# Patient Record
Sex: Female | Born: 1995 | Race: White | Hispanic: No | Marital: Single | Smoking: Never smoker
Health system: Southern US, Community
[De-identification: ages and names within clinical notes are randomized; demographics above are authoritative.]

## PROBLEM LIST (undated history)

## (undated) DIAGNOSIS — R55 Syncope and collapse: Secondary | ICD-10-CM

---

## 2016-05-28 ENCOUNTER — Emergency Department
Admission: EM | Admit: 2016-05-28 | Discharge: 2016-05-28 | Disposition: A | Discharge status: Home / Self Care | Payer: BLUE CROSS/BLUE SHIELD | Attending: Emergency Medicine | Admitting: Emergency Medicine

## 2016-05-28 ENCOUNTER — Emergency Department: Payer: BLUE CROSS/BLUE SHIELD

## 2016-05-28 ENCOUNTER — Encounter: Payer: Self-pay | Admitting: Emergency Medicine

## 2016-05-28 DIAGNOSIS — R1013 Epigastric pain: Secondary | ICD-10-CM | POA: Insufficient documentation

## 2016-05-28 DIAGNOSIS — R1030 Lower abdominal pain, unspecified: Secondary | ICD-10-CM | POA: Insufficient documentation

## 2016-05-28 DIAGNOSIS — R55 Syncope and collapse: Secondary | ICD-10-CM

## 2016-05-28 DIAGNOSIS — R112 Nausea with vomiting, unspecified: Secondary | ICD-10-CM | POA: Diagnosis not present

## 2016-05-28 DIAGNOSIS — R1031 Right lower quadrant pain: Secondary | ICD-10-CM

## 2016-05-28 HISTORY — DX: Syncope and collapse: R55

## 2016-05-28 LAB — CBC
HEMATOCRIT: 35.7 % (ref 35.0–47.0)
Hemoglobin: 11.8 g/dL — ABNORMAL LOW (ref 12.0–16.0)
MCH: 27.9 pg (ref 26.0–34.0)
MCHC: 33 g/dL (ref 32.0–36.0)
MCV: 84.5 fL (ref 80.0–100.0)
PLATELETS: 307 10*3/uL (ref 150–440)
RBC: 4.23 MIL/uL (ref 3.80–5.20)
RDW: 19.6 % — AB (ref 11.5–14.5)
WBC: 7.3 10*3/uL (ref 3.6–11.0)

## 2016-05-28 LAB — LIPASE, BLOOD: Lipase: 22 U/L (ref 11–51)

## 2016-05-28 LAB — URINALYSIS, COMPLETE (UACMP) WITH MICROSCOPIC
BACTERIA UA: NONE SEEN
Bilirubin Urine: NEGATIVE
Glucose, UA: NEGATIVE mg/dL
Ketones, ur: NEGATIVE mg/dL
NITRITE: NEGATIVE
PH: 8 (ref 5.0–8.0)
Protein, ur: 100 mg/dL — AB
SPECIFIC GRAVITY, URINE: 1.029 (ref 1.005–1.030)

## 2016-05-28 LAB — BASIC METABOLIC PANEL
Anion gap: 7 (ref 5–15)
BUN: 15 mg/dL (ref 6–20)
CO2: 26 mmol/L (ref 22–32)
CREATININE: 0.63 mg/dL (ref 0.44–1.00)
Calcium: 9.3 mg/dL (ref 8.9–10.3)
Chloride: 107 mmol/L (ref 101–111)
GFR calc Af Amer: >60 mL/min (ref >60)
GLUCOSE: 115 mg/dL — AB (ref 65–99)
POTASSIUM: 3.5 mmol/L (ref 3.5–5.1)
Sodium: 140 mmol/L (ref 135–145)

## 2016-05-28 LAB — WET PREP, GENITAL
Clue Cells Wet Prep HPF POC: NONE SEEN
Sperm: NONE SEEN
TRICH WET PREP: NONE SEEN
YEAST WET PREP: NONE SEEN

## 2016-05-28 LAB — HEPATIC FUNCTION PANEL
ALK PHOS: 56 U/L (ref 38–126)
ALT: 17 U/L (ref 14–54)
AST: 29 U/L (ref 15–41)
Albumin: 4.2 g/dL (ref 3.5–5.0)
BILIRUBIN TOTAL: 0.5 mg/dL (ref 0.3–1.2)
Total Protein: 7.4 g/dL (ref 6.5–8.1)

## 2016-05-28 LAB — POCT PREGNANCY, URINE: Preg Test, Ur: NEGATIVE

## 2016-05-28 LAB — CHLAMYDIA/NGC RT PCR (ARMC ONLY)
CHLAMYDIA TR: NOT DETECTED
N gonorrhoeae: NOT DETECTED

## 2016-05-28 MED ORDER — IBUPROFEN 800 MG PO TABS: 800.0000 mg | ORAL_TABLET | Freq: Three times a day (TID) | ORAL | 0 refills | Status: AC | PRN

## 2016-05-28 MED ORDER — KETOROLAC TROMETHAMINE 30 MG/ML IJ SOLN
30.0000 mg | Freq: Once | INTRAMUSCULAR | Status: AC
  Administered 2016-05-28: 30 mg via INTRAVENOUS
  Filled 2016-05-28: qty 1

## 2016-05-28 MED ORDER — SODIUM CHLORIDE 0.9 % IV BOLUS (SEPSIS)
1000.0000 mL | Freq: Once | INTRAVENOUS | Status: AC
  Administered 2016-05-28: 1000 mL via INTRAVENOUS

## 2016-05-28 MED ORDER — ONDANSETRON HCL 4 MG/2ML IJ SOLN
4.0000 mg | Freq: Once | INTRAMUSCULAR | Status: AC
  Administered 2016-05-28: 4 mg via INTRAVENOUS
  Filled 2016-05-28: qty 2

## 2016-05-28 NOTE — ED Triage Notes (Signed)
Pt comes into the ED via POV c/o RLQ abdominal pain right over the pubic bone.  Patient states she also had a syncopal episode earlier today as well.  Patient has h/o of sever menstrual cramps during her cycle and h/o syncopal episodes during that time.  Patient also has h/o anemia in the past. Patient c/o dizziness and lightheadedness.  Patient presents pale in color.

## 2016-05-28 NOTE — ED Provider Notes (Signed)
[facility] Emergency Department Provider Note  ____________________________________________  Time seen: Approximately 4:37 PM  I have reviewed the triage vital signs and the nursing notes.   HISTORY  Chief Complaint Abdominal Pain and Loss of Consciousness    HPI Jenna Patel is a 21 y.o. female with a history of recurrent syncope presenting with abdominal pain and syncope. The patient reports that 2 weeks ago she was diagnosed with influenza and started on Tamiflu and Sudafed. Afterwards, she developed significant abdominal bloating and diffuse pain, and was seen at [facility] in the primary care department, as her symptoms were resolving. She was discharged with instructions to follow up with her pain returned. Last night, the patient developed a midline and right lower quadrant abdominal pain "like small sticking knives" that is better if she presses on it or lays in fetal position and worse if she lays on her back or stands up. This morning, she began to menstrual rate. She has had one episode of vomiting due to pain. She also had a brief syncopal episode without any tonic-clonic activity, or injury. No constipation or diarrhea, change in vaginal discharge or dysuria. No fevers or chills. Travel outside the [place]. The patient is sexually active with men, and uses condoms. Patient has been worked up for gynecologic pain without any findings in the past, prior to becoming sexually active.  SH: sexually active, college student FH: no FH of IBD   Past Medical History:  Diagnosis Date  . Syncope     There are no active problems to display for this patient.   History reviewed. No pertinent surgical history.    Allergies Cats claw (uncaria tomentosa) and Dust mite extract  No family history on file.  Social History Social History  Substance Use Topics  . Smoking status: Never Smoker  . Smokeless tobacco: Never Used  . Alcohol use Yes     Review of Systems Constitutional: No fever/chills. Positive syncope. Negative myalgias. Eyes: No visual changes. No eye discharge. ENT: No sore throat. No congestion or rhinorrhea. Cardiovascular: Denies chest pain. Denies palpitations. Respiratory: Denies shortness of breath.  No cough. Gastrointestinal: Positive epigastric, right lower quadrant and suprapubic abdominal pain.  Positive nausea, positive vomiting.  No diarrhea.  No constipation. Genitourinary: Negative for dysuria. No urinary frequency. Positive menstruation at this time. Musculoskeletal: Negative for back pain. Skin: Negative for rash. Neurological: Negative for headaches. No focal numbness, tingling or weakness.   10-point ROS otherwise negative.  ____________________________________________   PHYSICAL EXAM:  VITAL SIGNS: ED Triage Vitals  Enc Vitals Group     BP 05/28/16 1555 113/67     Pulse Rate 05/28/16 1555 79     Resp 05/28/16 1555 18     Temp 05/28/16 1555 97.7 F (36.5 C)     Temp Source 05/28/16 1555 Oral     SpO2 05/28/16 1555 100 %     Weight 05/28/16 1555 127 lb (57.6 kg)     Height 05/28/16 1555 5' 6.5" (1.689 m)     Head Circumference --      Peak Flow --      Pain Score 05/28/16 1556 8     Pain Loc --      Pain Edu? --      Excl. in GC? --     Constitutional: Alert and oriented. Well appearing and in no acute distress. Answers questions appropriately. Eyes: Conjunctivae are normal.  EOMI. No scleral icterus. Head: Atraumatic. Nose: No congestion/rhinnorhea. Mouth/Throat:  Mucous membranes are moist.  Neck: No stridor.  Supple.  No meningismus. Cardiovascular: Normal rate, regular rhythm. No murmurs, rubs or gallops.  Respiratory: Normal respiratory effort.  No accessory muscle use or retractions. Lungs CTAB.  No wheezes, rales or ronchi. Gastrointestinal: Soft, and nondistended.  The patient has an internist to palpation in the epigastrium, suprapubic region, and most notably in the  right lower quadrant. No guarding or rebound.  No peritoneal signs. Genitourinary: Normal-appearing external genitalia without lesions. Vaginal exam with mild menstrual blood and mild mucous discharge, normal-appearing cervix, normal vaginal wall tissue. Bimanual exam is negative for CMT, positive right tenderness to palpation with fullness. Musculoskeletal: No LE edema.  Neurologic:  A&Ox3.  Speech is clear.  Face and smile are symmetric.  EOMI.  Moves all extremities well. Skin:  Skin is warm, dry and intact. No rash noted. Psychiatric: Mood and affect are normal. Speech and behavior are normal.  Normal judgement.  ____________________________________________   LABS (all labs ordered are listed, but only abnormal results are displayed)  Labs Reviewed  WET PREP, GENITAL - Abnormal; Notable for the following:       Result Value   WBC, Wet Prep HPF POC FEW (*)    All other components within normal limits  BASIC METABOLIC PANEL - Abnormal; Notable for the following:    Glucose, Bld 115 (*)    All other components within normal limits  CBC - Abnormal; Notable for the following:    Hemoglobin 11.8 (*)    RDW 19.6 (*)    All other components within normal limits  URINALYSIS, COMPLETE (UACMP) WITH MICROSCOPIC - Abnormal; Notable for the following:    Color, Urine YELLOW (*)    APPearance HAZY (*)    Hgb urine dipstick MODERATE (*)    Protein, ur 100 (*)    Leukocytes, UA TRACE (*)    Squamous Epithelial / LPF 0-5 (*)    All other components within normal limits  HEPATIC FUNCTION PANEL - Abnormal; Notable for the following:    Bilirubin, Direct <0.1 (*)    All other components within normal limits  CHLAMYDIA/NGC RT PCR ([facility] ONLY)  LIPASE, BLOOD  CBG MONITORING, ED  POC URINE PREG, ED  POCT PREGNANCY, URINE   ____________________________________________  EKG  ED ECG REPORT I, Rockne MenghiniNorman, Anne-Caroline, the attending physician, personally viewed and interpreted this  ECG.   Date: 05/28/2016  EKG Time: 1604  Rate: 68  Rhythm: normal sinus rhythm  Axis: normal  Intervals:none  ST&T Change: Nonspecific T-wave inversions in V1 and V2. No ST elevation. No evidence of Brugada syndrome, prolonged QTC, or hypertrophy.  ____________________________________________  RADIOLOGY  [place] Transvaginal Non-ob  Result Date: 05/28/2016 CLINICAL DATA:  Right lower quadrant abdominal pain. EXAM: TRANSABDOMINAL AND TRANSVAGINAL ULTRASOUND OF PELVIS DOPPLER ULTRASOUND OF OVARIES TECHNIQUE: Both transabdominal and transvaginal ultrasound examinations of the pelvis were performed. Transabdominal technique was performed for global imaging of the pelvis including uterus, ovaries, adnexal regions, and pelvic cul-de-sac. It was necessary to proceed with endovaginal exam following the transabdominal exam to visualize the ovaries. Color and duplex Doppler ultrasound was utilized to evaluate blood flow to the ovaries. COMPARISON:  None. FINDINGS: Uterus Measurements: 7.6 x 4.3 x 5.5 cm, within normal limits. No fibroids or other mass visualized. Endometrium Thickness: 7 mm, within normal limits. No focal abnormality visualized. Right ovary Measurements: 2.6 x 1.5 x 2.0 cm, within normal limits. Normal appearance/no adnexal mass. Left ovary Measurements: 2.0 x 2.5 x 2.6 cm, within normal  limits. Normal appearance/no adnexal mass. Pulsed Doppler evaluation of both ovaries demonstrates normal low-resistance arterial and venous waveforms. Other findings No abnormal free fluid. IMPRESSION: Negative pelvic ultrasound. No acute or focal lesion to explain the patient's right lower quadrant abdominal pain. Electronically Signed   By: Marin Roberts M.D.   On: 05/28/2016 19:08   US Pelvis Complete  Result Date: 05/28/2016 CLINICAL DATA:  Right lower quadrant abdominal pain. EXAM: TRANSABDOMINAL AND TRANSVAGINAL ULTRASOUND OF PELVIS DOPPLER ULTRASOUND OF OVARIES TECHNIQUE: Both transabdominal and  transvaginal ultrasound examinations of the pelvis were performed. Transabdominal technique was performed for global imaging of the pelvis including uterus, ovaries, adnexal regions, and pelvic cul-de-sac. It was necessary to proceed with endovaginal exam following the transabdominal exam to visualize the ovaries. Color and duplex Doppler ultrasound was utilized to evaluate blood flow to the ovaries. COMPARISON:  None. FINDINGS: Uterus Measurements: 7.6 x 4.3 x 5.5 cm, within normal limits. No fibroids or other mass visualized. Endometrium Thickness: 7 mm, within normal limits. No focal abnormality visualized. Right ovary Measurements: 2.6 x 1.5 x 2.0 cm, within normal limits. Normal appearance/no adnexal mass. Left ovary Measurements: 2.0 x 2.5 x 2.6 cm, within normal limits. Normal appearance/no adnexal mass. Pulsed Doppler evaluation of both ovaries demonstrates normal low-resistance arterial and venous waveforms. Other findings No abnormal free fluid. IMPRESSION: Negative pelvic ultrasound. No acute or focal lesion to explain the patient's right lower quadrant abdominal pain. Electronically Signed   By: Marin Roberts M.D.   On: 05/28/2016 19:08   [place] Art/ven Flow Abd Pelv Doppler  Result Date: 05/28/2016 CLINICAL DATA:  Right lower quadrant abdominal pain. EXAM: TRANSABDOMINAL AND TRANSVAGINAL ULTRASOUND OF PELVIS DOPPLER ULTRASOUND OF OVARIES TECHNIQUE: Both transabdominal and transvaginal ultrasound examinations of the pelvis were performed. Transabdominal technique was performed for global imaging of the pelvis including uterus, ovaries, adnexal regions, and pelvic cul-de-sac. It was necessary to proceed with endovaginal exam following the transabdominal exam to visualize the ovaries. Color and duplex Doppler ultrasound was utilized to evaluate blood flow to the ovaries. COMPARISON:  None. FINDINGS: Uterus Measurements: 7.6 x 4.3 x 5.5 cm, within normal limits. No fibroids or other mass visualized.  Endometrium Thickness: 7 mm, within normal limits. No focal abnormality visualized. Right ovary Measurements: 2.6 x 1.5 x 2.0 cm, within normal limits. Normal appearance/no adnexal mass. Left ovary Measurements: 2.0 x 2.5 x 2.6 cm, within normal limits. Normal appearance/no adnexal mass. Pulsed Doppler evaluation of both ovaries demonstrates normal low-resistance arterial and venous waveforms. Other findings No abnormal free fluid. IMPRESSION: Negative pelvic ultrasound. No acute or focal lesion to explain the patient's right lower quadrant abdominal pain. Electronically Signed   By: Marin Roberts M.D.   On: 05/28/2016 19:08    ____________________________________________   PROCEDURES  Procedure(s) performed: None  Procedures  Critical Care performed: No ____________________________________________   INITIAL IMPRESSION / ASSESSMENT AND PLAN / ED COURSE  Pertinent labs & imaging results that were available during my care of the patient were reviewed by me and considered in my medical decision making (see chart for details).  21 y.o. female presenting with suprapubic, epigastric and right lower quadrant pain associated with one episode of nausea and vomiting, as well as a two-week history of bloating. Overall, the patient has reassuring vital signs and is afebrile. There are multiple possible causes for her symptoms. I will perform a pelvic examination to determine whether she warrants imaging by ultrasound of her pelvis or CT scan of the abdomen. Ovarian pathology including  cyst or torsion is possible, I would also consider appendicitis. Inflammatory bowel disease is also possible although the patient has not been having any diarrhea and has no family history of this. She may also have gas, IBS, or pain from her menstruation.  ----------------------------------------- 7:54 PM on 05/28/2016 -----------------------------------------  The patient is completely symptomatic at this time.  Her white blood cell count is normal, or left lites are normal, her pelvic ultrasound does not show any acute pathology. Her urinalysis is not sure UTI and she is not pregnant. At this time, I have reevaluated her and she has no tenderness on my examination. Appendicitis with a very unlikely although I am discharging her with appendectomy precautions. Plan discharge. Close PMD follow-up, gynecology follow-up and return directions were discussed. ____________________________________________  FINAL CLINICAL IMPRESSION(S) / ED DIAGNOSES  Final diagnoses:  Lower abdominal pain  Epigastric pain  Non-intractable vomiting with nausea, unspecified vomiting type  Syncope, unspecified syncope type         NEW MEDICATIONS STARTED DURING THIS VISIT:  New Prescriptions   No medications on file      Rockne Menghini, MD 05/28/16 1954

## 2016-05-28 NOTE — ED Notes (Signed)
Pt discharged to home.  Friend driving.  Discharge instructions reviewed.  Verbalized understanding.  No questions or concerns at this time.  Teach back verified.  Pt in NAD.  No items left in ED.   

## 2016-05-28 NOTE — Discharge Instructions (Signed)
Please return to the emergency department for severe pain, fever, inability to keep down fluids, lightheadedness or fainting, or any other symptoms concerning to you.

## 2018-03-20 IMAGING — US US TRANSVAGINAL NON-OB
1 series · 13 of 25 positions shown · non-contrast
Comparison: None.

CLINICAL DATA: Right lower quadrant abdominal pain.

EXAM:
TRANSABDOMINAL AND TRANSVAGINAL ULTRASOUND OF PELVIS
DOPPLER ULTRASOUND OF OVARIES
TECHNIQUE: Both transabdominal and transvaginal ultrasound examinations of the
pelvis were performed. Transabdominal technique was performed for
global imaging of the pelvis including uterus, ovaries, adnexal
regions, and pelvic cul-de-sac.
It was necessary to proceed with endovaginal exam following the
transabdominal exam to visualize the ovaries. Color and duplex
Doppler ultrasound was utilized to evaluate blood flow to the
ovaries.

[Series 1: us transvaginal non-ob · 0.20mm/px · 13 of 101 slices shown]
[im 1/101]
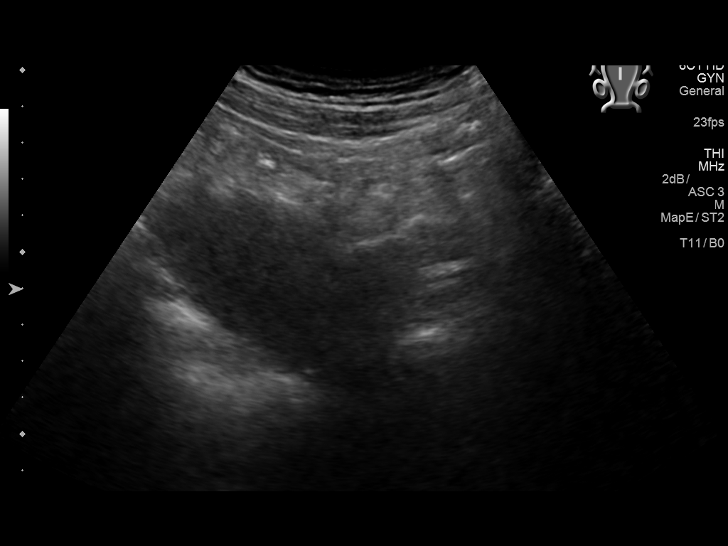
[im 9/101]
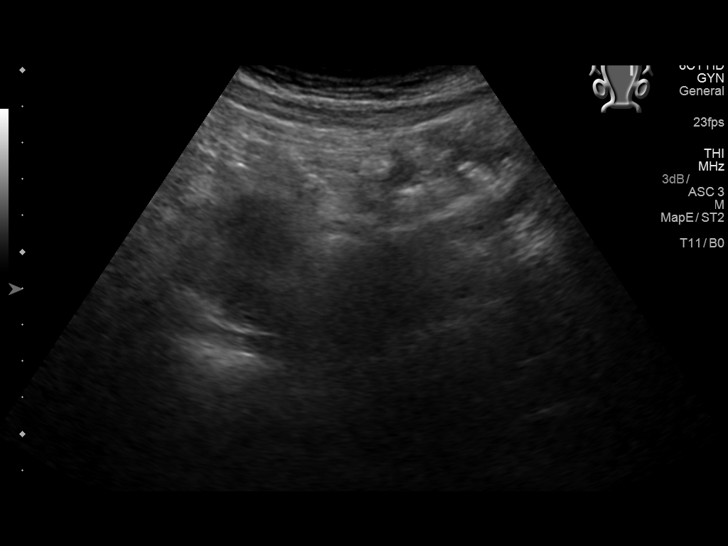
[im 17/101]
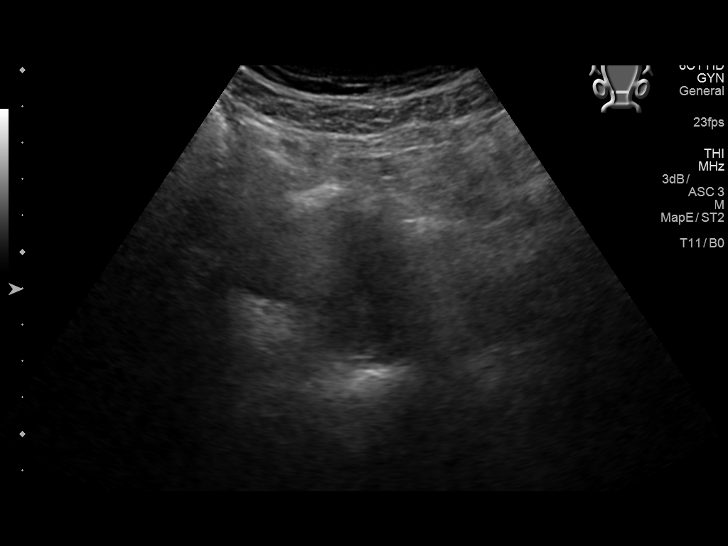
[im 26/101]
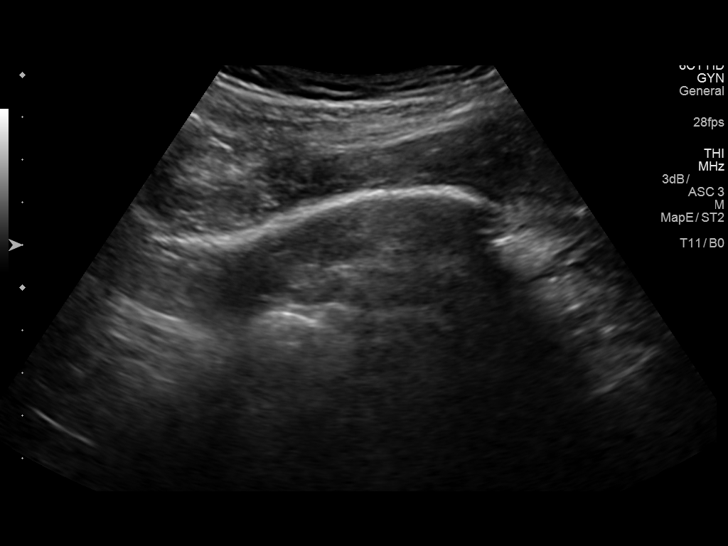
[im 34/101]
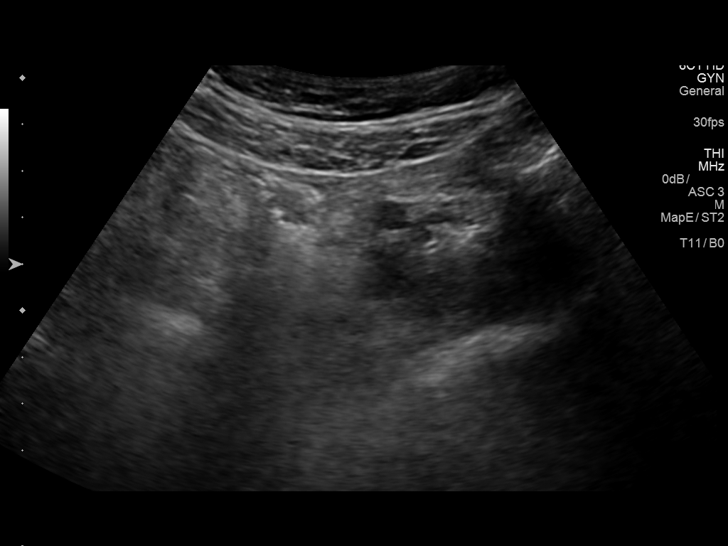
[im 42/101]
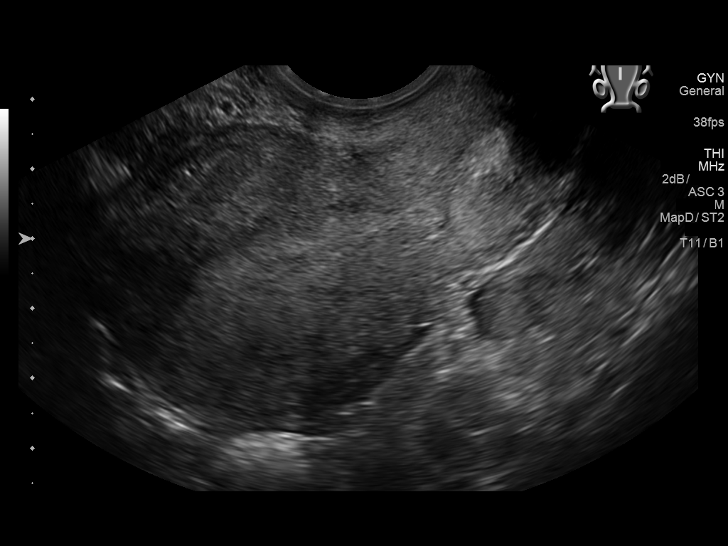
[im 51/101]
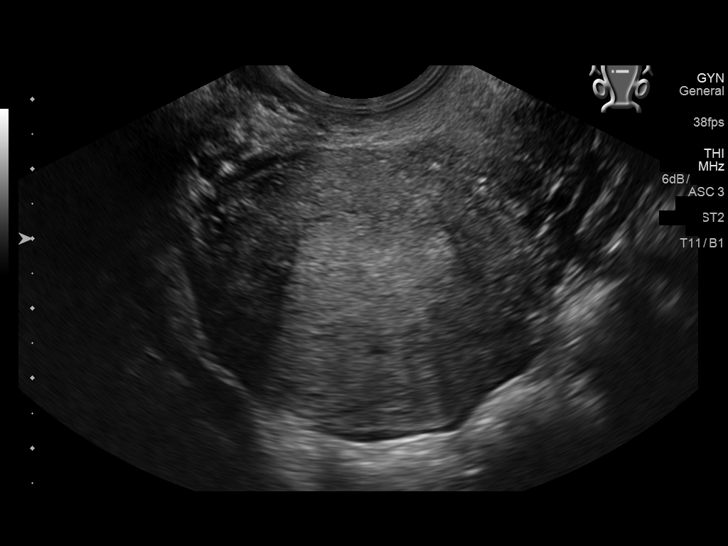
[im 59/101]
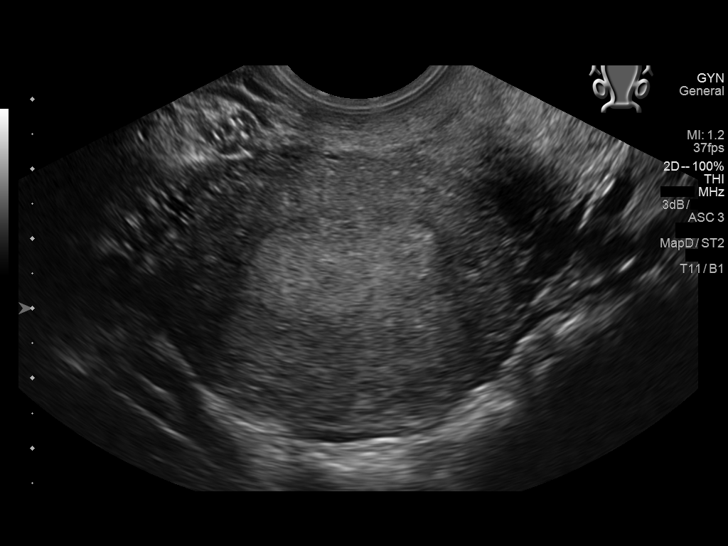
[im 67/101]
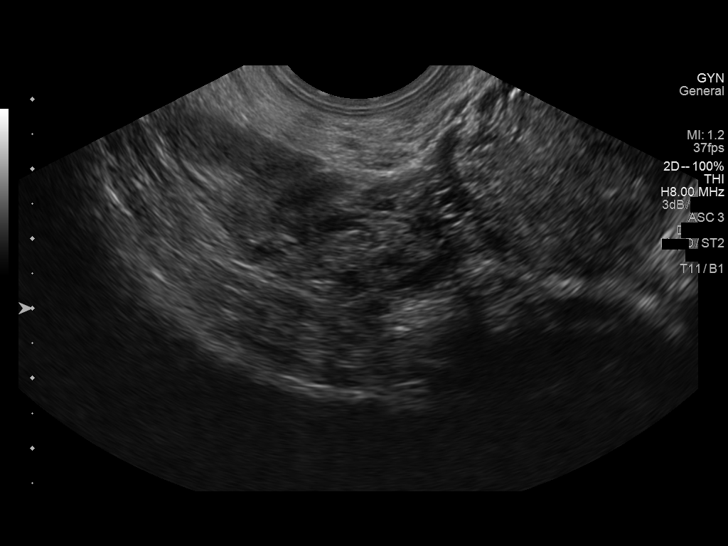
[im 76/101]
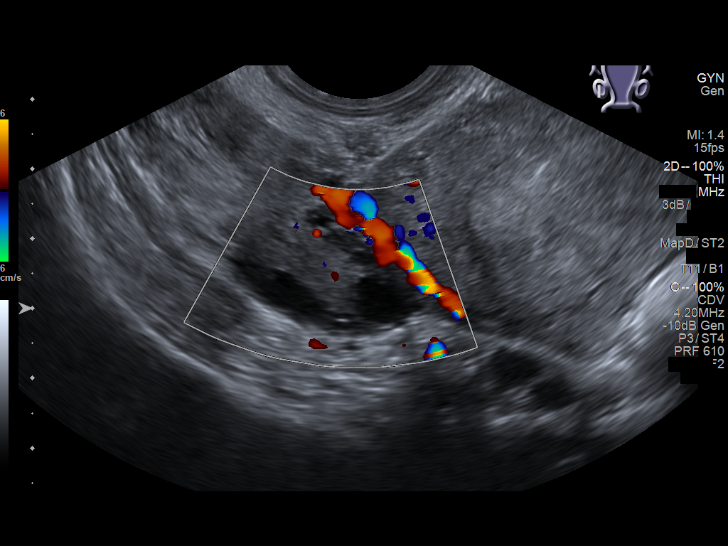
[im 84/101]
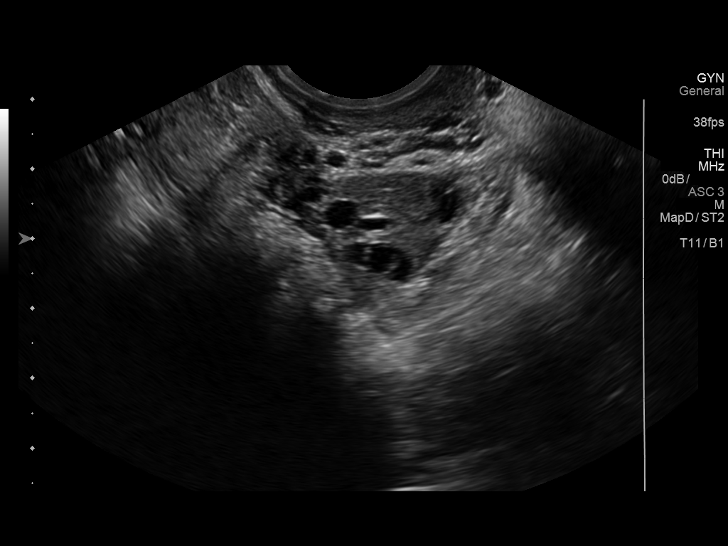
[im 92/101]
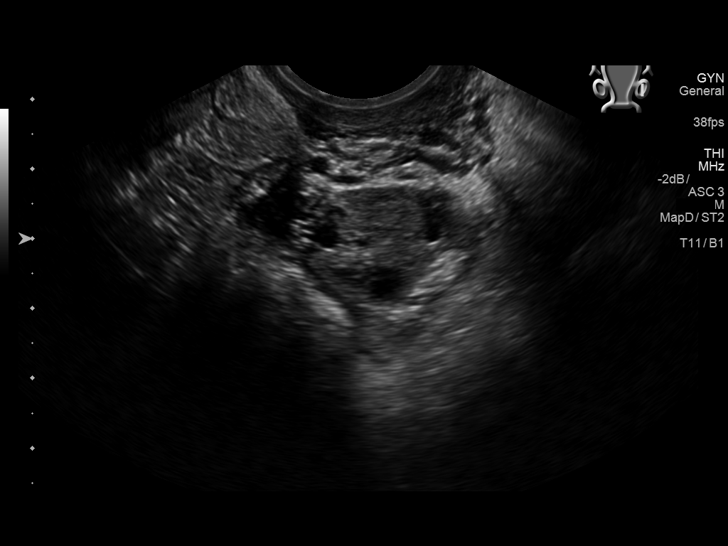
[im 101/101]
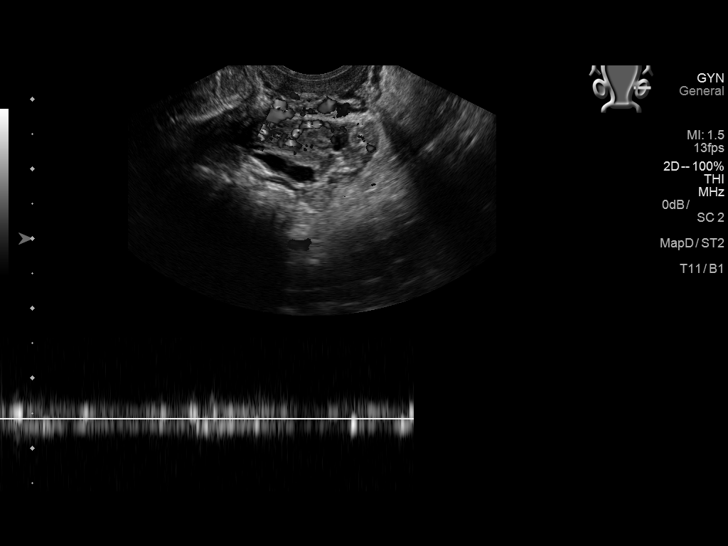

[13 of 25 positions shown; findings below may reference images not displayed]

FINDINGS: Uterus

Measurements: 7.6 x 4.3 x 5.5 cm, within normal limits. No fibroids
or other mass visualized.

Endometrium

Thickness: 7 mm, within normal limits. No focal abnormality
visualized.

Right ovary

Measurements: 2.6 x 1.5 x 2.0 cm, within normal limits. Normal
appearance/no adnexal mass.

Left ovary

Measurements: 2.0 x 2.5 x 2.6 cm, within normal limits. Normal
appearance/no adnexal mass.

Pulsed Doppler evaluation of both ovaries demonstrates normal
low-resistance arterial and venous waveforms.

Other findings

No abnormal free fluid.
IMPRESSION: Negative pelvic ultrasound. No acute or focal lesion to explain the
patient's right lower quadrant abdominal pain.
